# Patient Record
Sex: Male | Born: 1970 | Race: White | Hispanic: No | Marital: Single | Smoking: Current every day smoker
Health system: Southern US, Community
[De-identification: ages and names within clinical notes are randomized; demographics above are authoritative.]

## PROBLEM LIST (undated history)

## (undated) HISTORY — PX: KNEE SURGERY: SHX244

---

## 2009-12-22 ENCOUNTER — Ambulatory Visit: Payer: Self-pay | Admitting: Family Medicine

## 2011-06-20 ENCOUNTER — Ambulatory Visit: Payer: Self-pay | Admitting: Internal Medicine
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
## 2015-11-21 ENCOUNTER — Encounter: Payer: Self-pay | Admitting: *Deleted

## 2015-11-21 ENCOUNTER — Emergency Department
Admission: EM | Admit: 2015-11-21 | Discharge: 2015-11-21 | Disposition: A | Discharge status: Home / Self Care | Payer: Self-pay | Attending: Emergency Medicine | Admitting: Emergency Medicine

## 2015-11-21 DIAGNOSIS — S39012A Strain of muscle, fascia and tendon of lower back, initial encounter: Secondary | ICD-10-CM | POA: Insufficient documentation

## 2015-11-21 DIAGNOSIS — F17210 Nicotine dependence, cigarettes, uncomplicated: Secondary | ICD-10-CM | POA: Insufficient documentation

## 2015-11-21 DIAGNOSIS — Y929 Unspecified place or not applicable: Secondary | ICD-10-CM | POA: Insufficient documentation

## 2015-11-21 DIAGNOSIS — Y939 Activity, unspecified: Secondary | ICD-10-CM | POA: Insufficient documentation

## 2015-11-21 DIAGNOSIS — M545 Low back pain, unspecified: Secondary | ICD-10-CM

## 2015-11-21 DIAGNOSIS — X58XXXA Exposure to other specified factors, initial encounter: Secondary | ICD-10-CM | POA: Insufficient documentation

## 2015-11-21 DIAGNOSIS — T148XXA Other injury of unspecified body region, initial encounter: Secondary | ICD-10-CM

## 2015-11-21 DIAGNOSIS — Y999 Unspecified external cause status: Secondary | ICD-10-CM | POA: Insufficient documentation

## 2015-11-21 LAB — BASIC METABOLIC PANEL
Anion gap: 7 (ref 5–15)
BUN: 15 mg/dL (ref 6–20)
CO2: 26 mmol/L (ref 22–32)
CREATININE: 0.73 mg/dL (ref 0.61–1.24)
Calcium: 9.1 mg/dL (ref 8.9–10.3)
Chloride: 103 mmol/L (ref 101–111)
GFR calc Af Amer: >60 mL/min (ref >60)
GFR calc non Af Amer: >60 mL/min (ref >60)
Glucose, Bld: 112 mg/dL — ABNORMAL HIGH (ref 65–99)
Potassium: 4.1 mmol/L (ref 3.5–5.1)
SODIUM: 136 mmol/L (ref 135–145)

## 2015-11-21 LAB — URINALYSIS COMPLETE WITH MICROSCOPIC (ARMC ONLY)
Bacteria, UA: NONE SEEN
Bilirubin Urine: NEGATIVE
GLUCOSE, UA: NEGATIVE mg/dL
HGB URINE DIPSTICK: NEGATIVE
Ketones, ur: NEGATIVE mg/dL
Leukocytes, UA: NEGATIVE
NITRITE: NEGATIVE
Protein, ur: NEGATIVE mg/dL
SPECIFIC GRAVITY, URINE: 1.015 (ref 1.005–1.030)
Squamous Epithelial / LPF: NONE SEEN
pH: 9 — ABNORMAL HIGH (ref 5.0–8.0)

## 2015-11-21 LAB — CBC
HCT: 43.7 % (ref 40.0–52.0)
Hemoglobin: 15.6 g/dL (ref 13.0–18.0)
MCH: 32 pg (ref 26.0–34.0)
MCHC: 35.6 g/dL (ref 32.0–36.0)
MCV: 89.8 fL (ref 80.0–100.0)
PLATELETS: 187 10*3/uL (ref 150–440)
RBC: 4.86 MIL/uL (ref 4.40–5.90)
RDW: 14.9 % — AB (ref 11.5–14.5)
WBC: 6.4 10*3/uL (ref 3.8–10.6)

## 2015-11-21 MED ORDER — OXYCODONE HCL 5 MG PO TABS: 5.0000 mg | ORAL_TABLET | Freq: Four times a day (QID) | ORAL | 0 refills | Status: DC | PRN

## 2015-11-21 MED ORDER — LIDOCAINE 5 % EX PTCH: 1.0000 | MEDICATED_PATCH | Freq: Two times a day (BID) | CUTANEOUS | 0 refills | Status: AC

## 2015-11-21 MED ORDER — DIAZEPAM 5 MG PO TABS: 5.0000 mg | ORAL_TABLET | Freq: Two times a day (BID) | ORAL | 0 refills | Status: DC | PRN

## 2015-11-21 NOTE — ED Notes (Signed)
Pt verbalized understanding of discharge instructions. NAD at this time. 

## 2015-11-21 NOTE — ED Triage Notes (Signed)
Pt reports right sided flank pain since Thursday, pt denies any other symptoms

## 2015-11-21 NOTE — Discharge Instructions (Signed)
You were prescribed a medication that is potentially sedating. Do not drink alcohol, drive or participate in any other potentially dangerous activities while taking this medication as it may make you sleepy. Do not take this medication with any other sedating medications, either prescription or over-the-counter. If you were prescribed Percocet or Vicodin, do not take these with acetaminophen (Tylenol) as it is already contained within these medications. °  °Opioid pain medications (or "narcotics") can be habit forming.  Use it as little as possible to achieve adequate pain control.  Do not use or use it with extreme caution if you have a history of opiate abuse or dependence.  If you are on a pain contract with your primary care doctor or a pain specialist, be sure to let them know you were prescribed this medication today from the [facility].  This medication is intended for your use only - do not give any to anyone else and keep it in a secure place where nobody else, especially children and pets, have access to it.  It will also cause or worsen constipation, so you may want to consider taking an over-the-counter stool softener while you are taking this medication. ° °
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
## 2015-12-22 ENCOUNTER — Emergency Department
Admission: EM | Admit: 2015-12-22 | Discharge: 2015-12-22 | Disposition: A | Discharge status: Home / Self Care | Payer: Self-pay | Attending: Student in an Organized Health Care Education/Training Program | Admitting: Student in an Organized Health Care Education/Training Program

## 2015-12-22 ENCOUNTER — Encounter: Payer: Self-pay | Admitting: Emergency Medicine

## 2015-12-22 DIAGNOSIS — M5441 Lumbago with sciatica, right side: Secondary | ICD-10-CM | POA: Insufficient documentation

## 2015-12-22 DIAGNOSIS — M5431 Sciatica, right side: Secondary | ICD-10-CM

## 2015-12-22 DIAGNOSIS — Z79899 Other long term (current) drug therapy: Secondary | ICD-10-CM | POA: Insufficient documentation

## 2015-12-22 DIAGNOSIS — F17210 Nicotine dependence, cigarettes, uncomplicated: Secondary | ICD-10-CM | POA: Insufficient documentation

## 2015-12-22 MED ORDER — DIAZEPAM 5 MG PO TABS
5.0000 mg | ORAL_TABLET | Freq: Once | ORAL | Status: AC
  Administered 2015-12-22: 5 mg via ORAL
  Filled 2015-12-22: qty 1

## 2015-12-22 MED ORDER — PREDNISONE 10 MG (21) PO TBPK: ORAL_TABLET | ORAL | 0 refills | Status: AC

## 2015-12-22 MED ORDER — CYCLOBENZAPRINE HCL 10 MG PO TABS: 10.0000 mg | ORAL_TABLET | Freq: Three times a day (TID) | ORAL | 0 refills | Status: AC | PRN

## 2015-12-22 MED ORDER — DEXAMETHASONE SODIUM PHOSPHATE 10 MG/ML IJ SOLN
10.0000 mg | Freq: Once | INTRAMUSCULAR | Status: AC
  Administered 2015-12-22: 10 mg via INTRAMUSCULAR
  Filled 2015-12-22: qty 1

## 2015-12-22 MED ORDER — OXYCODONE HCL 5 MG PO TABS
10.0000 mg | ORAL_TABLET | Freq: Once | ORAL | Status: AC
  Administered 2015-12-22: 10 mg via ORAL
  Filled 2015-12-22: qty 2

## 2015-12-22 MED ORDER — OXYCODONE-ACETAMINOPHEN 5-325 MG PO TABS: 1.0000 | ORAL_TABLET | ORAL | 0 refills | Status: AC | PRN

## 2015-12-22 NOTE — ED Triage Notes (Signed)
Having lower back pain which radiates into right leg  Pain became worse this weekend

## 2015-12-22 NOTE — ED Provider Notes (Signed)
[facility] Emergency Department Provider Note ____________________________________________  Time seen: Approximately 5:45 PM  I have reviewed the triage vital signs and the nursing notes.   HISTORY  Chief Complaint No chief complaint on file.    HPI Noah Montes is a 45 y.o. male who presents to the emergency department for evaluation of right lower back pain. Pain started about a month ago. He was evaluated here and diagnosed with sciatica. Pain improved for a little while, but has returned. Pain starts in the right lower back and travels down the right buttock. He can not find a comfortable position. Pain prevents rest. No relief with tylenol, ibuprofen, Bio-Freeze, or Aleeve. 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 No specific injury.  History reviewed. No pertinent past medical history.  There are no active problems to display for this patient.   History reviewed. No pertinent surgical history.  Prior to Admission medications   Medication Sig Start Date End Date Taking? Authorizing Provider  cyclobenzaprine (FLEXERIL) 10 MG tablet Take 1 tablet (10 mg total) by mouth 3 (three) times daily as needed for muscle spasms. 12/22/15   Chinita Pester, FNP  lidocaine (LIDODERM) 5 % Place 1 patch onto the skin every 12 (twelve) hours. Remove & Discard patch within 12 hours or as directed by MD 11/21/15   Sharman Cheek, MD  oxyCODONE-acetaminophen (ROXICET) 5-325 MG tablet Take 1 tablet by mouth every 4 (four) hours as needed for severe pain. 12/22/15   Chinita Pester, FNP  predniSONE (STERAPRED UNI-PAK 21 TAB) 10 MG (21) TBPK tablet Take 6 tablets on day 1 Take 5 tablets on day 2 Take 4 tablets on day 3 Take 3 tablets on day 4 Take 2 tablets on day 5 Take 1 tablet on day 6 12/22/15   Chinita Pester, FNP    Allergies Review of patient's allergies indicates no known allergies.  No family history on  file.  Social History Social History  Substance Use Topics  . Smoking status: Current Every Day Smoker    Packs/day: 1.00    Types: Cigarettes  . Smokeless tobacco: Never Used  . Alcohol use No    Review of Systems Constitutional: No recent illness. Cardiovascular: Denies chest pain or palpitations. Respiratory: Denies shortness of breath. Musculoskeletal: Pain in Right lower back  Skin: Negative for rash, wound, lesion. Neurological: Negative for focal weakness or numbness.  ____________________________________________   PHYSICAL EXAM:  VITAL SIGNS: ED Triage Vitals  Enc Vitals Group     BP 12/22/15 1730 (!) 164/81     Pulse Rate 12/22/15 1730 84     Resp 12/22/15 1730 20     Temp 12/22/15 1730 98.5 F (36.9 C)     Temp Source 12/22/15 1730 Oral     SpO2 12/22/15 1730 99 %     Weight 12/22/15 1731 250 lb (113.4 kg)     Height 12/22/15 1731 6\' 1"  (1.854 m)     Head Circumference --      Peak Flow --      Pain Score 12/22/15 1731 8     Pain Loc --      Pain Edu? --      Excl. in GC? --     Constitutional: Alert and oriented. Well appearing and in no acute distress. Eyes: Conjunctivae are normal. EOMI. Head: Atraumatic. Neck: No stridor.  Respiratory: Normal respiratory effort.   Musculoskeletal: Full ROM throughout. Positive straight  leg raise on the right at 40*.  Neurologic:  Normal speech and language. No gross focal neurologic deficits are appreciated. Speech is normal. No gait instability. Skin:  Skin is warm, dry and intact. Atraumatic. Psychiatric: Mood and affect are normal. Speech and behavior are normal.  ____________________________________________   LABS (all labs ordered are listed, but only abnormal results are displayed)  Labs Reviewed - No data to display ____________________________________________  RADIOLOGY  Not indicated. ____________________________________________   PROCEDURES  Procedure(s) performed:  None   ____________________________________________   INITIAL IMPRESSION / ASSESSMENT AND PLAN / ED COURSE  Clinical Course    Pertinent labs & imaging results that were available during my care of the patient were reviewed by me and considered in my medical decision making (see chart for details).  IM Decadron, roxicodone, and valium given while in the ER. He received adequate pain relief and was discharged home with flexeril, percocet, and prednisone prescriptions. He is aware to start the prednisone tomorrow night. He was given a work note as well. He was strongly encouraged to follow up with orthopedics and states he plans to do so as soon as his insurance starts. He was advised to return to the emergency department for symptoms that change or worsen if unable to schedule an appointment. ____________________________________________   FINAL CLINICAL IMPRESSION(S) / ED DIAGNOSES  Final diagnoses:  Sciatica of right side       Chinita PesterCari B Nandana Krolikowski, FNP 12/22/15 2011    Willy EddyPatrick Robinson, MD 12/22/15 2144

## 2016-03-06 ENCOUNTER — Encounter: Payer: Self-pay | Admitting: Emergency Medicine

## 2016-03-06 ENCOUNTER — Emergency Department
Admission: EM | Admit: 2016-03-06 | Discharge: 2016-03-06 | Disposition: A | Discharge status: Home / Self Care | Payer: Self-pay | Attending: Emergency Medicine | Admitting: Emergency Medicine

## 2016-03-06 ENCOUNTER — Emergency Department: Payer: Self-pay

## 2016-03-06 DIAGNOSIS — M5431 Sciatica, right side: Secondary | ICD-10-CM

## 2016-03-06 DIAGNOSIS — M5441 Lumbago with sciatica, right side: Secondary | ICD-10-CM | POA: Insufficient documentation

## 2016-03-06 DIAGNOSIS — F17210 Nicotine dependence, cigarettes, uncomplicated: Secondary | ICD-10-CM | POA: Insufficient documentation

## 2016-03-06 MED ORDER — METHOCARBAMOL 500 MG PO TABS: 500.0000 mg | ORAL_TABLET | Freq: Four times a day (QID) | ORAL | 0 refills | Status: AC

## 2016-03-06 MED ORDER — MELOXICAM 15 MG PO TABS: 15.0000 mg | ORAL_TABLET | Freq: Every day | ORAL | 0 refills | Status: AC

## 2016-03-06 MED ORDER — KETOROLAC TROMETHAMINE 30 MG/ML IJ SOLN
30.0000 mg | Freq: Once | INTRAMUSCULAR | Status: AC
  Administered 2016-03-06: 30 mg via INTRAMUSCULAR
  Filled 2016-03-06: qty 1

## 2016-03-06 NOTE — ED Provider Notes (Signed)
[facility] Emergency Department Provider Note  ____________________________________________  Time seen: Approximately 3:39 PM  I have reviewed the triage vital signs and the nursing notes.   HISTORY  Chief Complaint Back Pain    HPI Noah Montes is a 45 y.o. male who presents to emergency department complaining of increased lower back pain and radicular symptoms. Patient states that he has a known diagnosis of sciatica and injured his back over the weekend. Patient states he went to sit in a chair when one of his nephews pointed out from underneath him causing him to land on his buttocks. Patient is complaining of increasing pain. He denies any saddle anesthesia, bowel or bladder dysfunction, paresthesias. Patient states he has been taking Tylenol and Motrin with no relief. No complaint or injury.  Patient states that he will be following up with orthopedics for this complaint, when he gets insurance. Patient states that he started a job at the first part of October and his insurance does not kick in until the end of the year. Patient states that he'll follow with orthopedics at that time.   History reviewed. No pertinent past medical history.  There are no active problems to display for this patient.   Past Surgical History:  Procedure Laterality Date  . KNEE SURGERY      Prior to Admission medications   Medication Sig Start Date End Date Taking? Authorizing Provider  cyclobenzaprine (FLEXERIL) 10 MG tablet Take 1 tablet (10 mg total) by mouth 3 (three) times daily as needed for muscle spasms. 12/22/15   Chinita Pester, FNP  lidocaine (LIDODERM) 5 % Place 1 patch onto the skin every 12 (twelve) hours. Remove & Discard patch within 12 hours or as directed by MD 11/21/15   Sharman Cheek, MD  meloxicam (MOBIC) 15 MG tablet Take 1 tablet (15 mg total) by mouth daily. 03/06/16   Delorise Royals Cuthriell, PA-C  methocarbamol (ROBAXIN) 500 MG tablet Take 1  tablet (500 mg total) by mouth 4 (four) times daily. 03/06/16   Delorise Royals Cuthriell, PA-C  oxyCODONE-acetaminophen (ROXICET) 5-325 MG tablet Take 1 tablet by mouth every 4 (four) hours as needed for severe pain. 12/22/15   Chinita Pester, FNP  predniSONE (STERAPRED UNI-PAK 21 TAB) 10 MG (21) TBPK tablet Take 6 tablets on day 1 Take 5 tablets on day 2 Take 4 tablets on day 3 Take 3 tablets on day 4 Take 2 tablets on day 5 Take 1 tablet on day 6 12/22/15   Chinita Pester, FNP    Allergies Patient has no known allergies.  History reviewed. No pertinent family history.  Social History Social History  Substance Use Topics  . Smoking status: Current Every Day Smoker    Packs/day: 1.00    Types: Cigarettes  . Smokeless tobacco: Never Used  . Alcohol use Yes     Review of Systems  Constitutional: No fever/chills Eyes: No visual changes. No discharge ENT: No upper respiratory complaints. Cardiovascular: no chest pain. Respiratory: no cough. No SOB. Gastrointestinal: No abdominal pain.  No nausea, no vomiting.  No diarrhea.  No constipation. Musculoskeletal: Positive for lower back pain with right-sided sciatica Skin: Negative for rash, abrasions, lacerations, ecchymosis. Neurological: Negative for headaches, focal weakness or numbness. 10-point ROS otherwise negative.  ____________________________________________   PHYSICAL EXAM:  VITAL SIGNS: ED Triage Vitals  Enc Vitals Group     BP 03/06/16 1517 (!) 161/100     Pulse Rate 03/06/16 1517 94  Resp 03/06/16 1517 17     Temp 03/06/16 1517 98.1 F (36.7 C)     Temp Source 03/06/16 1517 Oral     SpO2 03/06/16 1517 100 %     Weight 03/06/16 1519 260 lb (117.9 kg)     Height 03/06/16 1519 6\' 1"  (1.854 m)     Head Circumference --      Peak Flow --      Pain Score 03/06/16 1519 8     Pain Loc --      Pain Edu? --      Excl. in GC? --      Constitutional: Alert and oriented. Well appearing and in no acute  distress. Eyes: Conjunctivae are normal. PERRL. EOMI. Head: Atraumatic Neck: No stridor.    Cardiovascular: Normal rate, regular rhythm. Normal S1 and S2.  Good peripheral circulation. Respiratory: Normal respiratory effort without tachypnea or retractions. Lungs CTAB. Good air entry to the bases with no decreased or absent breath sounds. Musculoskeletal: Full range of motion to all extremities. No gross deformities appreciated.No visible deformity suspect inspection. Patient is nontender to palpation in the lumbar spine region. Patient is very tender to palpation of her right-sided sciatic notch. Dorsalis pedis pulse intact lower extremities. Sensation intact and equal lower extremities. Neurologic:  Normal speech and language. No gross focal neurologic deficits are appreciated.  Skin:  Skin is warm, dry and intact. No rash noted. Psychiatric: Mood and affect are normal. Speech and behavior are normal. Patient exhibits appropriate insight and judgement.   ____________________________________________   LABS (all labs ordered are listed, but only abnormal results are displayed)  Labs Reviewed - No data to display ____________________________________________  EKG   ____________________________________________  RADIOLOGY Festus BarrenI, Jonathan D Cuthriell, personally viewed and evaluated these images (plain radiographs) as part of my medical decision making, as well as reviewing the written report by the radiologist.  Dg Lumbar Spine Complete  Result Date: 03/06/2016 CLINICAL DATA:  PT STATES HE IS DEALING WITH SCIATIC PAIN DOWN RIGHT LEG 3-4 MONTHS. PT FELL THANKSGIVING DAY. HE WENT TO SIT DOWN IN A CHAIR AND HIS NEPHEW PULLED IT OUT FROM UNDERNEATH HIM. PATIENT LANDED ON BUTT. EXAM: LUMBAR SPINE - COMPLETE 4+ VIEW COMPARISON:  None. FINDINGS: There is no evidence of lumbar spine fracture. Alignment is normal. Intervertebral disc spaces are maintained. IMPRESSION: Negative. Electronically Signed    By: Norva PavlovElizabeth  Brown M.D.   On: 03/06/2016 16:12    ____________________________________________    PROCEDURES  Procedure(s) performed:    Procedures    Medications  ketorolac (TORADOL) 30 MG/ML injection 30 mg (not administered)     ____________________________________________   INITIAL IMPRESSION / ASSESSMENT AND PLAN / ED COURSE  Pertinent labs & imaging results that were available during my care of the patient were reviewed by me and considered in my medical decision making (see chart for details).  Review of the [place] CSRS was performed in accordance of the [facility] prior to dispensing any controlled drugs.  Clinical Course     Patient's diagnosis is consistent with Right-sided sciatica. Patient's exam is reassuring and x-ray reveals no acute osseous abnormality. No further imaging as deemed necessary at this time. Patient has increased sciatic symptoms status post injury. He is given a shot of Toradol in the emergency department.. Patient will be discharged home with prescriptions for anti-inflammatory muscle relaxer. Patient is to follow up with orthopedics as needed or otherwise directed. Patient is given ED precautions to return to the ED for any  worsening or new symptoms.     ____________________________________________  FINAL CLINICAL IMPRESSION(S) / ED DIAGNOSES  Final diagnoses:  Sciatica of right side      NEW MEDICATIONS STARTED DURING THIS VISIT:  New Prescriptions   MELOXICAM (MOBIC) 15 MG TABLET    Take 1 tablet (15 mg total) by mouth daily.   METHOCARBAMOL (ROBAXIN) 500 MG TABLET    Take 1 tablet (500 mg total) by mouth 4 (four) times daily.        This chart was dictated using voice recognition software/Dragon. Despite best efforts to proofread, errors can occur which can change the meaning. Any change was purely unintentional.    Racheal PatchesJonathan D Cuthriell, PA-C 03/06/16 1654    Jeanmarie PlantJames A McShane, MD 03/08/16 1131

## 2016-03-06 NOTE — ED Triage Notes (Signed)
Fell on Saturday, hx of chronic back issues but this aggravated it. pain shooting into right leg. Been taking tylenol and ibuprofen with no relief.

## 2016-08-29 ENCOUNTER — Ambulatory Visit: Payer: Self-pay | Admitting: Family Medicine

## 2017-07-31 ENCOUNTER — Ambulatory Visit: Payer: Self-pay | Admitting: Urology

## 2017-09-06 ENCOUNTER — Ambulatory Visit: Payer: Self-pay | Admitting: Urology

## 2017-09-06 ENCOUNTER — Encounter: Payer: Self-pay | Admitting: Urology
